# Patient Record
Sex: Female | Born: 1995 | Hispanic: No | Marital: Single | Smoking: Never smoker
Health system: Southern US, Community
[De-identification: ages and names within clinical notes are randomized; demographics above are authoritative.]

## PROBLEM LIST (undated history)

## (undated) DIAGNOSIS — F319 Bipolar disorder, unspecified: Secondary | ICD-10-CM

## (undated) DIAGNOSIS — F32A Depression, unspecified: Secondary | ICD-10-CM

---

## 2020-03-11 ENCOUNTER — Ambulatory Visit
Admission: EM | Admit: 2020-03-11 | Discharge: 2020-03-11 | Disposition: A | Discharge status: Home / Self Care | Payer: Self-pay | Attending: Emergency Medicine | Admitting: Emergency Medicine

## 2020-03-11 ENCOUNTER — Other Ambulatory Visit: Payer: Self-pay

## 2020-03-11 ENCOUNTER — Encounter: Payer: Self-pay | Admitting: Emergency Medicine

## 2020-03-11 ENCOUNTER — Ambulatory Visit (INDEPENDENT_AMBULATORY_CARE_PROVIDER_SITE_OTHER): Payer: Self-pay

## 2020-03-11 DIAGNOSIS — M79672 Pain in left foot: Secondary | ICD-10-CM

## 2020-03-11 DIAGNOSIS — M84375A Stress fracture, left foot, initial encounter for fracture: Secondary | ICD-10-CM

## 2020-03-11 MED ORDER — IBUPROFEN 600 MG PO TABS
600.0000 mg | ORAL_TABLET | Freq: Four times a day (QID) | ORAL | 0 refills | Status: DC | PRN
Start: 2020-03-11 — End: 2022-03-01

## 2020-03-11 NOTE — ED Triage Notes (Signed)
Patient in today c/o left foot pain x 3 weeks. No injury noted. Patient has taken OTC Ibuprofen without relief.

## 2020-03-11 NOTE — ED Provider Notes (Signed)
HPI  SUBJECTIVE:  Kathy Williams is a 24 y.o. female who presents with 3 weeks of left foot pain at the third and fourth metatarsals.  Is located along the dorsum of the foot.  She states it is sharp, constant during the day.  She reports swelling on the dorsum of the foot at the end of the day.  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  She wears chacos which have thin straps in the area of pain.  She states that her foot was bothering her starting about 3 weeks ago, but got acutely worse after jumping up and down 2 weeks ago.  She denies bruising, erythema, fevers, body aches.  No increase in her activity.  She tried ibuprofen 400 mg 2 or 3 times a day, rest.  The ibuprofen, rest and nonweightbearing helped.  Symptoms worse with weightbearing, palpation.  It is not associated with moving her toes.  She denies pain along the plantar aspect of her foot.  She has never had symptoms like this before.  Has medical history none for diabetes, neuropathy, peripheral vascular disease, peripheral arterial disease.  LMP: Now.  Denies possibility being pregnant.  PMD: None.  History reviewed. No pertinent past medical history.  History reviewed. No pertinent surgical history.  Family History  Problem Relation Age of Onset  . Diabetes Mother   . Hypertension Mother   . Atrial fibrillation Father   . Skin cancer Father     Social History   Tobacco Use  . Smoking status: Never Smoker  . Smokeless tobacco: Never Used  Vaping Use  . Vaping Use: Never used  Substance Use Topics  . Alcohol use: Yes    Comment: occassional  . Drug use: Never    No current facility-administered medications for this encounter.  Current Outpatient Medications:  Marland Kitchen  Multiple Vitamin (MULTIVITAMIN) tablet, Take 1 tablet by mouth daily., Disp: , Rfl:  .  ibuprofen (ADVIL) 600 MG tablet, Take 1 tablet (600 mg total) by mouth every 6 (six) hours as needed., Disp: 30 tablet, Rfl: 0  No Known  Allergies   ROS  As noted in HPI.   Physical Exam  BP 121/69 (BP Location: Left Arm)   Pulse 65   Temp 98.5 F (36.9 C) (Oral)   Resp 18   Ht 5\' 6"  (1.676 m)   Wt 79.4 kg   LMP 03/08/2020 (Exact Date)   SpO2 100%   BMI 28.25 kg/m   Constitutional: Well developed, well nourished, no acute distress Eyes:  EOMI, conjunctiva normal bilaterally HENT: Normocephalic, atraumatic,mucus membranes moist Respiratory: Normal inspiratory effort Cardiovascular: Normal rate GI: nondistended skin: No rash, skin intact Musculoskeletal: Left foot: Positive tenderness along the distal third, fourth metatarsals and at the MTP joints.  No tenderness along the plantar aspect of the foot.  Midfoot NT. Base of fifth metatarsal NT. No bruising. Skin intact. DP 2+. Refill less than 2 seconds. Sensation grossly intact. Patient able to move all toes actively.   no pain with inversion / eversion,  dorsiflexion / plantarflexion. No Tenderness along the plantar fascia. Distal fibula NT, Medial malleolus NT,  Deltoid ligament NT, Lateral ligaments NT, Achilles NT. Patient able to bear weight while in department. Neurologic: Alert & oriented x 3, no focal neuro deficits Psychiatric: Speech and behavior appropriate   ED Course   Medications - No data to display  Orders Placed This Encounter  Procedures  . DG Foot Complete Left    Standing Status:  Standing    Number of Occurrences:   1    Order Specific Question:   Reason for Exam (SYMPTOM  OR DIAGNOSIS REQUIRED)    Answer:   pain x 3 weeks, no injury noted    Order Specific Question:   Is patient pregnant?    Answer:   No  . Post op shoe    Standing Status:   Standing    Number of Occurrences:   1    Order Specific Question:   Laterality    Answer:   Left  . Apply ace wrap    Standing Status:   Standing    Number of Occurrences:   1    No results found for this or any previous visit (from the past 24 hour(s)). DG Foot Complete  Left  Addendum Date: 03/11/2020   ADDENDUM REPORT: 03/11/2020 20:12 ADDENDUM: This study was reviewed with the ordering clinician. Patient has point tenderness at the distal margin of the third metatarsal, corresponding to an area of cortical thickening and mild periosteal reaction on this exam. In retrospect, this is consistent with stress reaction of the third metatarsal. Electronically Signed   By: Sharlet Salina M.D.   On: 03/11/2020 20:12   Result Date: 03/11/2020 CLINICAL DATA:  Left foot pain for 3 weeks EXAM: LEFT FOOT - COMPLETE 3+ VIEW COMPARISON:  None. FINDINGS: Frontal, oblique, and lateral views of the left foot are obtained. No fracture, subluxation, or dislocation. Joint spaces are well preserved. Soft tissues are normal. IMPRESSION: 1. Unremarkable left foot. Electronically Signed: By: Sharlet Salina M.D. On: 03/11/2020 19:58    ED Clinical Impression  1. Stress fracture of left foot, initial encounter      ED Assessment/Plan  Reviewed imaging independently and discussed with radiology. No displaced fx. positive bone callus the distal third metatarsal consistent with a healing stress fracture as read by me and agreed with by radiology. See radiology report for full details.  Patient with healing stress fracture.  Will place in a stiff soled shoe, Ace wrap, Tylenol/ibuprofen.  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  will have her follow-up with podiatry in 2 to 3 weeks.   Discussed imaging, MDM, treatment plan, and plan for follow-up with patient.  patient agrees with plan.   Meds ordered this encounter  Medications  . ibuprofen (ADVIL) 600 MG tablet    Sig: Take 1 tablet (600 mg total) by mouth every 6 (six) hours as needed.    Dispense:  30 tablet    Refill:  0    *This clinic note was created using Scientist, clinical (histocompatibility and immunogenetics). Therefore, there may be occasional mistakes despite careful  proofreading.   ?    Domenick Gong, MD 03/11/20 2021

## 2020-03-11 NOTE — Discharge Instructions (Signed)
Wear the Ace wrap and stiff soled shoe.  Try and decrease the amount of activity that you do.  You have a stress fracture and it will not heal until you give it a rest for 4 to 6 weeks.  Follow-up with Dr. Alberteen Spindle podiatry in several weeks.  Take 600 mg of ibuprofen combined with 1000 g of Tylenol 3-4 times a day as needed for pain.  Ice, elevation.

## 2021-01-19 ENCOUNTER — Ambulatory Visit
Admission: RE | Admit: 2021-01-19 | Discharge: 2021-01-19 | Disposition: A | Discharge status: Home / Self Care | Payer: BC Managed Care – PPO | Source: Ambulatory Visit | Attending: Family Medicine | Admitting: Family Medicine

## 2021-01-19 ENCOUNTER — Ambulatory Visit
Admission: EM | Admit: 2021-01-19 | Discharge: 2021-01-19 | Disposition: A | Discharge status: Home / Self Care | Payer: BC Managed Care – PPO | Attending: Family Medicine | Admitting: Family Medicine

## 2021-01-19 ENCOUNTER — Other Ambulatory Visit: Payer: Self-pay

## 2021-01-19 DIAGNOSIS — R1013 Epigastric pain: Secondary | ICD-10-CM | POA: Insufficient documentation

## 2021-01-19 DIAGNOSIS — R1011 Right upper quadrant pain: Secondary | ICD-10-CM | POA: Insufficient documentation

## 2021-01-19 LAB — COMPREHENSIVE METABOLIC PANEL
ALT: 17 U/L (ref 0–44)
AST: 19 U/L (ref 15–41)
Albumin: 4.7 g/dL (ref 3.5–5.0)
Alkaline Phosphatase: 53 U/L (ref 38–126)
Anion gap: 3 — ABNORMAL LOW (ref 5–15)
BUN: 13 mg/dL (ref 6–20)
CO2: 26 mmol/L (ref 22–32)
Calcium: 9.7 mg/dL (ref 8.9–10.3)
Chloride: 106 mmol/L (ref 98–111)
Creatinine, Ser: 0.68 mg/dL (ref 0.44–1.00)
GFR, Estimated: >60 mL/min (ref >60)
Glucose, Bld: 92 mg/dL (ref 70–99)
Potassium: 3.8 mmol/L (ref 3.5–5.1)
Sodium: 135 mmol/L (ref 135–145)
Total Bilirubin: 0.5 mg/dL (ref 0.3–1.2)
Total Protein: 8.3 g/dL — ABNORMAL HIGH (ref 6.5–8.1)

## 2021-01-19 LAB — CBC WITH DIFFERENTIAL/PLATELET
Abs Immature Granulocytes: 0.02 10*3/uL (ref 0.00–0.07)
Basophils Absolute: 0 10*3/uL (ref 0.0–0.1)
Basophils Relative: 0 %
Eosinophils Absolute: 0.1 10*3/uL (ref 0.0–0.5)
Eosinophils Relative: 1 %
HCT: 38.6 % (ref 36.0–46.0)
Hemoglobin: 12.8 g/dL (ref 12.0–15.0)
Immature Granulocytes: 0 %
Lymphocytes Relative: 18 %
Lymphs Abs: 1.5 10*3/uL (ref 0.7–4.0)
MCH: 29 pg (ref 26.0–34.0)
MCHC: 33.2 g/dL (ref 30.0–36.0)
MCV: 87.3 fL (ref 80.0–100.0)
Monocytes Absolute: 0.7 10*3/uL (ref 0.1–1.0)
Monocytes Relative: 8 %
Neutro Abs: 6.2 10*3/uL (ref 1.7–7.7)
Neutrophils Relative %: 73 %
Platelets: 305 10*3/uL (ref 150–400)
RBC: 4.42 MIL/uL (ref 3.87–5.11)
RDW: 13.2 % (ref 11.5–15.5)
WBC: 8.5 10*3/uL (ref 4.0–10.5)
nRBC: 0 % (ref 0.0–0.2)

## 2021-01-19 LAB — PREGNANCY, URINE: Preg Test, Ur: NEGATIVE

## 2021-01-19 MED ORDER — PANTOPRAZOLE SODIUM 40 MG PO TBEC: 40.0000 mg | DELAYED_RELEASE_TABLET | Freq: Every day | ORAL | 0 refills | Status: DC

## 2021-01-19 NOTE — ED Triage Notes (Signed)
Patient presents to Urgent Care with complaints of mid epigastric pain with intermittent vomiting and diarrhea since May. She states she has also lost 10 lbs from constant diarrhea. No GI history. Treating symptoms with Tums with no relief.   Denies fever.

## 2021-01-19 NOTE — Discharge Instructions (Addendum)
We will call with US results.  Medication as prescribed.  Take care  Dr. Cincere Zorn  

## 2021-01-20 NOTE — ED Provider Notes (Signed)
[facility]    CSN: [id number] Arrival date & time: 01/19/21  1143      History   Chief Complaint Chief Complaint  Patient presents with   Abdominal Pain    HPI  25 year old female presents with the above complaint.  Ongoing, intermittent abdominal pain since May. Located in the upper abdomen. Unsure of relation to food. Has had some nausea and vomiting. No significant diarrhea. Reports 10 lb weight loss. Mild pain currently. Last period was late May/early June. No fever. No relieving factors. No other associated symptoms.   Home Medications    Prior to Admission medications   Medication Sig Start Date End Date Taking? Authorizing Provider  pantoprazole (PROTONIX) 40 MG tablet Take 1 tablet (40 mg total) by mouth daily. 01/19/21  Yes Edelmira Gallogly G, DO  ibuprofen (ADVIL) 600 MG tablet Take 1 tablet (600 mg total) by mouth every 6 (six) hours as needed. 03/11/20   Melynda Ripple, MD  Multiple Vitamin (MULTIVITAMIN) tablet Take 1 tablet by mouth daily.    [provider]    Family History Family History  Problem Relation Age of Onset   Diabetes Mother    Hypertension Mother    Atrial fibrillation Father    Skin cancer Father     Social History Social History   Tobacco Use   Smoking status: Never   Smokeless tobacco: Never  Vaping Use   Vaping Use: Never used  Substance Use Topics   Alcohol use: Yes    Comment: occassional   Drug use: Never     Allergies   Patient has no known allergies.   Review of Systems Review of Systems  Constitutional:  Positive for unexpected weight change.  Gastrointestinal:  Positive for abdominal pain, nausea and vomiting.    Physical Exam Triage Vital Signs ED Triage Vitals  Enc Vitals Group     BP 01/19/21 1332 126/80     Pulse Rate 01/19/21 1332 (!) 58     Resp 01/19/21 1332 16     Temp 01/19/21 1332 98.5 F (36.9 C)     Temp Source 01/19/21 1332 Oral     SpO2 01/19/21 1332 100 %     Weight --       Height --      Head Circumference --      Peak Flow --      Pain Score 01/19/21 1335 0     Pain Loc --      Pain Edu? --      Excl. in [place]? --    Updated Vital Signs BP 126/80 (BP Location: Left Arm)   Pulse (!) 58   Temp 98.5 F (36.9 C) (Oral)   Resp 16   LMP 12/23/2020 (Approximate)   SpO2 100%   Visual Acuity Right Eye Distance:   Left Eye Distance:   Bilateral Distance:    Right Eye Near:   Left Eye Near:    Bilateral Near:     Physical Exam Vitals and nursing note reviewed.  Constitutional:      General: She is not in acute distress.    Appearance: Normal appearance. She is not ill-appearing.  HENT:     Head: Normocephalic and atraumatic.  Eyes:     Conjunctiva/sclera: Conjunctivae normal.  Cardiovascular:     Rate and Rhythm: Regular rhythm. Bradycardia present.  Pulmonary:     Effort: Pulmonary effort is normal.     Breath sounds: Normal breath sounds. No wheezing  or rales.  Abdominal:     General: There is no distension.     Palpations: Abdomen is soft.     Comments: Tenderness to palpation in the epigastric region and RUQ.   Neurological:     Mental Status: She is alert.  Psychiatric:        Mood and Affect: Mood normal.        Behavior: Behavior normal.     UC Treatments / Results  Labs (all labs ordered are listed, but only abnormal results are displayed) Labs Reviewed  COMPREHENSIVE METABOLIC PANEL - Abnormal; Notable for the following components:      Result Value   Total Protein 8.3 (*)    Anion gap 3 (*)    All other components within normal limits  PREGNANCY, URINE  CBC WITH DIFFERENTIAL/PLATELET    EKG   Radiology US Abdomen Limited RUQ (LIVER/GB)  Result Date: 01/19/2021 CLINICAL DATA:  Right upper quadrant abdominal pain EXAM: ULTRASOUND ABDOMEN LIMITED RIGHT UPPER QUADRANT COMPARISON:  None. FINDINGS: Gallbladder: No gallstones or wall thickening visualized. No sonographic Murphy sign noted by sonographer. Common bile  duct: Diameter: 3.3 mm.  Normal. Liver: No focal lesion identified. Within normal limits in parenchymal echogenicity. Portal vein is patent on color Doppler imaging with normal direction of blood flow towards the liver. Other: None. IMPRESSION: Normal right upper quadrant ultrasound. No abnormality seen to explain pain. Electronically Signed   By: Nelson Chimes M.D.   On: 01/19/2021 16:09    Procedures Procedures (including critical care time)  Medications Ordered in UC Medications - No data to display  Initial Impression / Assessment and Plan / UC Course  I have reviewed the triage vital signs and the nursing notes.  Pertinent labs & imaging results that were available during my care of the patient were reviewed by me and considered in my medical decision making (see chart for details).    25 year old female presents with epigastric and RUQ pain. Labs unremarkable (normal WBC, alk phos, LFTs). Urine preg negative. RUQ [place] normal as well. Placing on protonix for suspected GERD vs gastritis. Supportive care.   Final Clinical Impressions(s) / UC Diagnoses   Final diagnoses:  Abdominal pain, epigastric  RUQ pain     Discharge Instructions      We will call with [place] results.  Medication as prescribed.  Take care  Dr. Lacinda Axon    ED Prescriptions     Medication Sig Dispense Auth. Provider   pantoprazole (PROTONIX) 40 MG tablet Take 1 tablet (40 mg total) by mouth daily. 30 tablet Coral Spikes, DO      PDMP not reviewed this encounter.   Coral Spikes, [place] 01/20/21 2154

## 2021-03-11 ENCOUNTER — Ambulatory Visit
Admission: EM | Admit: 2021-03-11 | Discharge: 2021-03-11 | Disposition: A | Discharge status: Home / Self Care | Payer: BC Managed Care – PPO | Attending: Sports Medicine | Admitting: Sports Medicine

## 2021-03-11 ENCOUNTER — Other Ambulatory Visit: Payer: Self-pay

## 2021-03-11 DIAGNOSIS — R112 Nausea with vomiting, unspecified: Secondary | ICD-10-CM

## 2021-03-11 DIAGNOSIS — R1084 Generalized abdominal pain: Secondary | ICD-10-CM

## 2021-03-11 DIAGNOSIS — R110 Nausea: Secondary | ICD-10-CM

## 2021-03-11 MED ORDER — ONDANSETRON HCL 4 MG PO TABS
4.0000 mg | ORAL_TABLET | Freq: Four times a day (QID) | ORAL | 0 refills | Status: DC
Start: 2021-03-11 — End: 2022-03-01

## 2021-03-11 MED ORDER — FAMOTIDINE 20 MG PO TABS
20.0000 mg | ORAL_TABLET | Freq: Two times a day (BID) | ORAL | 0 refills | Status: DC
Start: 2021-03-11 — End: 2022-03-01

## 2021-03-11 NOTE — ED Triage Notes (Signed)
Pt c/o continued abdominal pain and nausea for several months. Pt was seen for this recently and given Pantoprazole, pt is unsure if this helped or not. Pt reports current episode has lasted about 3 days. Pt denies f/v/d, urinary or bowel problems.

## 2021-03-11 NOTE — ED Provider Notes (Signed)
[facility]    CSN: [id number] Arrival date & time: 03/11/21  [id number]      History   Chief Complaint Chief Complaint  Patient presents with   Abdominal Pain    HPI Kathy Williams is a 25 y.o. female.   25 year old female who presents for evaluation of intermittent persistent abdominal pain.  She has not had it for about 3 months.  Was seen here in the urgent care by Dr. Adriana Simasook back in June.  Had an ultrasound of her abdomen that was normal.  She reports that her pain is generalized.  She points over the epigastric as well as the right and left upper quadrant.  She reports about a 10 pound weight loss.  She says she does have associated nausea.  She is unsure if it is related to food.  She does state that she does have some emesis at times.  There is no blood or bile in the emesis.  She denies a family history of personal history of Crohn's disease, ileitis or colitis.  Last menstrual period was last week.  She denies pregnancy.  She denies any vaginal discharge or bleeding.  She is sexually active with condoms only.  She does not use oral birth control.  She denies any urinary symptoms including hematuria, dysuria, increased frequency or urgency.  She denies any fever shakes chills.  She denies any chest pain or shortness of breath.  She was given a trial of a PPI for 30 days but she said that did not help her.  She also reports no diarrhea.  She works at a camp.  She denies any tick bite exposure.  No rash.  No real aggravating or relieving factors.  No red flag signs or symptoms elicited on history.   History reviewed. No pertinent past medical history.  There are no problems to display for this patient.   History reviewed. No pertinent surgical history.  OB History   No obstetric history on file.      Home Medications    Prior to Admission medications   Medication Sig Start Date End Date Taking? Authorizing Provider  famotidine (PEPCID) 20 MG tablet Take 1 tablet (20 mg  total) by mouth 2 (two) times daily. 03/11/21  Yes Delton SeeBarnes, Rether Rison, MD  ondansetron (ZOFRAN) 4 MG tablet Take 1 tablet (4 mg total) by mouth every 6 (six) hours. 03/11/21  Yes Delton SeeBarnes, Quintell Bonnin, MD  ibuprofen (ADVIL) 600 MG tablet Take 1 tablet (600 mg total) by mouth every 6 (six) hours as needed. 03/11/20   Domenick GongMortenson, Ashley, MD  Multiple Vitamin (MULTIVITAMIN) tablet Take 1 tablet by mouth daily.    [provider]  pantoprazole (PROTONIX) 40 MG tablet Take 1 tablet (40 mg total) by mouth daily. 01/19/21   Tommie Samsook, Jayce G, DO    Family History Family History  Problem Relation Age of Onset   Diabetes Mother    Hypertension Mother    Atrial fibrillation Father    Skin cancer Father     Social History Social History   Tobacco Use   Smoking status: Never   Smokeless tobacco: Never  Vaping Use   Vaping Use: Never used  Substance Use Topics   Alcohol use: Yes    Comment: occassional   Drug use: Never     Allergies   Patient has no known allergies.   Review of Systems Review of Systems  Constitutional:  Negative for activity change, appetite change, chills, diaphoresis, fatigue and fever.  HENT:  Negative for congestion, ear pain, postnasal drip, rhinorrhea, sinus pressure, sinus pain, sneezing and sore throat.   Eyes:  Negative for pain.  Respiratory:  Negative for cough, chest tightness and shortness of breath.   Cardiovascular:  Negative for chest pain and palpitations.  Gastrointestinal:  Positive for abdominal pain, nausea and vomiting. Negative for abdominal distention, anal bleeding, blood in stool, constipation, diarrhea and rectal pain.  Genitourinary:  Negative for dysuria, flank pain, frequency, hematuria, menstrual problem, pelvic pain, urgency, vaginal bleeding, vaginal discharge and vaginal pain.  Musculoskeletal:  Negative for back pain, myalgias and neck pain.  Skin:  Negative for color change, pallor, rash and wound.  Neurological:  Negative for dizziness,  light-headedness and headaches.  All other systems reviewed and are negative.   Physical Exam Triage Vital Signs ED Triage Vitals  Enc Vitals Group     BP 03/11/21 0917 103/63     Pulse Rate 03/11/21 0917 68     Resp 03/11/21 0917 18     Temp 03/11/21 0917 98.4 F (36.9 C)     Temp Source 03/11/21 0917 Oral     SpO2 03/11/21 0917 100 %     Weight 03/11/21 0915 160 lb (72.6 kg)     Height 03/11/21 0915 5\' 6"  (1.676 m)     Head Circumference --      Peak Flow --      Pain Score 03/11/21 0914 8     Pain Loc --      Pain Edu? --      Excl. in [place]? --    No data found.  Updated Vital Signs BP 103/63 (BP Location: Left Arm)   Pulse 68   Temp 98.4 F (36.9 C) (Oral)   Resp 18   Ht 5\' 6"  (1.676 m)   Wt 72.6 kg   LMP 03/07/2021   SpO2 100%   BMI 25.82 kg/m   Visual Acuity Right Eye Distance:   Left Eye Distance:   Bilateral Distance:    Right Eye Near:   Left Eye Near:    Bilateral Near:     Physical Exam Vitals and nursing note reviewed.  Constitutional:      General: She is not in acute distress.    Appearance: Normal appearance. She is well-developed. She is not ill-appearing, toxic-appearing or diaphoretic.  HENT:     Head: Normocephalic and atraumatic.     Nose: Nose normal.     Mouth/Throat:     Mouth: Mucous membranes are moist.     Pharynx: No pharyngeal swelling.  Eyes:     General: No scleral icterus.    Extraocular Movements: Extraocular movements intact.     Conjunctiva/sclera: Conjunctivae normal.     Pupils: Pupils are equal, round, and reactive to light.  Cardiovascular:     Rate and Rhythm: Normal rate and regular rhythm.     Pulses: Normal pulses.     Heart sounds: Normal heart sounds. No murmur heard.   No friction rub. No gallop.  Pulmonary:     Effort: Pulmonary effort is normal.     Breath sounds: Normal breath sounds. No stridor. No wheezing, rhonchi or rales.  Abdominal:     General: Abdomen is flat. Bowel sounds are normal. There  is no distension. There are no signs of injury.     Palpations: Abdomen is soft. There is no fluid wave, hepatomegaly or splenomegaly.     Tenderness: There is generalized abdominal tenderness. There is no right CVA  tenderness, left CVA tenderness, guarding or rebound. Negative signs include Murphy's sign, Rovsing's sign and McBurney's sign.  Musculoskeletal:     Cervical back: Normal range of motion and neck supple.  Skin:    General: Skin is warm and dry.     Capillary Refill: Capillary refill takes less than 2 seconds.  Neurological:     General: No focal deficit present.     Mental Status: She is alert and oriented to person, place, and time.     UC Treatments / Results  Labs (all labs ordered are listed, but only abnormal results are displayed) Labs Reviewed - No data to display  EKG   Radiology No results found.  Procedures Procedures (including critical care time)  Medications Ordered in UC Medications - No data to display  Initial Impression / Assessment and Plan / UC Course  I have reviewed the triage vital signs and the nursing notes.  Pertinent labs & imaging results that were available during my care of the patient were reviewed by me and considered in my medical decision making (see chart for details).  Clinical impression: 1.  Generalized abdominal pain 2.  Nausea 3.  Non-intractable vomiting  Treatment plan: 1.  The findings and treatment plan were discussed in detail with the patient.  Patient was in agreement. 2.  I recommended doing a trial of Pepcid to see if that assists her. 3.  Also gave her some Zofran to help with her nausea. 4.  She is going to need to establish care with a primary care physician and put a referral in for assistance.  Also gave her instructions on how to do that and contact her insurance company to see which providers in the area or taking new patients. 5.  Gave her the name of 2 gastroenterology groups locally that she can  contact.  Ultimately she is going to need to see them and have a further work-up. 6.  Educational handouts provided. 7.  Just supportive care for now medication and monitor symptoms.  Asked her to to take a log of when she gets the symptoms and what she has eaten prior to it.  She voiced verbal understanding. 8.  If symptoms persist or worsen in any way have advised her to go to the ER to obtain a higher level of care including labs and advanced imaging. 9.  She was stable upon discharge and will follow-up here as needed.    Final Clinical Impressions(s) / UC Diagnoses   Final diagnoses:  Generalized abdominal pain  Nausea  Non-intractable vomiting with nausea, unspecified vomiting type     Discharge Instructions      As we discussed, your abdominal pain is chronic in nature.  We will do a trial of a different medication for this.  I have also prescribed you something for your nausea. You do need to establish care with a primary care provider.  Please check the back of your insurance card.  I have sent in a referral to have somebody assist you with getting a PCP. You do need to see GI.  I have provided you the name of a few providers locally.  Please call them and see if they can see you for this. If your symptoms persist you may need advanced imaging which we would recommend you go to the emergency room to have done. Given that you are not having significant symptoms today I will not check your labs or urine as you are not having no  symptoms. Again if your symptoms in your abdomen persist you will need to have labs as well as advanced imaging which I would recommend to go to the ER to have done.     ED Prescriptions     Medication Sig Dispense Auth. Provider   famotidine (PEPCID) 20 MG tablet Take 1 tablet (20 mg total) by mouth 2 (two) times daily. 60 tablet Delton See, MD   ondansetron (ZOFRAN) 4 MG tablet Take 1 tablet (4 mg total) by mouth every 6 (six) hours. 20 tablet  Delton See, MD      PDMP not reviewed this encounter.   Delton See, MD 03/11/21 1022

## 2021-03-11 NOTE — Discharge Instructions (Addendum)
As we discussed, your abdominal pain is chronic in nature.  We will do a trial of a different medication for this.  I have also prescribed you something for your nausea. You do need to establish care with a primary care provider.  Please check the back of your insurance card.  I have sent in a referral to have somebody assist you with getting a PCP. You do need to see GI.  I have provided you the name of a few providers locally.  Please call them and see if they can see you for this. If your symptoms persist you may need advanced imaging which we would recommend you go to the emergency room to have done. Given that you are not having significant symptoms today I will not check your labs or urine as you are not having no symptoms. Again if your symptoms in your abdomen persist you will need to have labs as well as advanced imaging which I would recommend to go to the ER to have done.

## 2021-12-16 ENCOUNTER — Ambulatory Visit
Admission: EM | Admit: 2021-12-16 | Discharge: 2021-12-16 | Disposition: A | Discharge status: Home / Self Care | Payer: BC Managed Care – PPO

## 2021-12-16 ENCOUNTER — Ambulatory Visit (INDEPENDENT_AMBULATORY_CARE_PROVIDER_SITE_OTHER): Payer: BC Managed Care – PPO

## 2021-12-16 DIAGNOSIS — M7989 Other specified soft tissue disorders: Secondary | ICD-10-CM | POA: Diagnosis not present

## 2021-12-16 DIAGNOSIS — M79644 Pain in right finger(s): Secondary | ICD-10-CM | POA: Diagnosis not present

## 2021-12-16 HISTORY — DX: Bipolar disorder, unspecified: F31.9

## 2021-12-16 HISTORY — DX: Depression, unspecified: F32.A

## 2021-12-16 NOTE — Discharge Instructions (Addendum)
Your x-rays did not demonstrate any broken bones but it does demonstrate a calcification in the soft tissue where you are having pain and feeling the hardball and your skin.  I am can give you the contact information for Dr. Berta Minor with [facility].  She is a hand specialist.  I would call her and make an appointment for follow-up to see what may be able to be done to help you with your pain.  In the meantime use over-the-counter ibuprofen according to the package instructions.  You may also apply ice to your hand for 20 minutes at a time 2-3 times a day as needed.

## 2021-12-16 NOTE — ED Triage Notes (Signed)
Patient is here for "ball I feel just below middle finger at palm on inside of hand". Seems to be painful when touching/pressing on area. No injury known.

## 2021-12-16 NOTE — ED Provider Notes (Signed)
[facility]    CSN: [id number] Arrival date & time: 12/16/21  1848      History   Chief Complaint Chief Complaint  Patient presents with   Hand Problem    HPI Kathy Williams is a 26 y.o. female.   HPI  26 year old female here for evaluation of right hand pain.  Patient reports that for the last month she has felt a hard ball under the skin of her right middle finger where her finger meets her hand.  She is unaware of any injury and she denies any weakness in her grip.  She states that it does hurt when she grips items or when she touches or presses on the area.  There is no redness, no swelling, no bruising, and no numbness or tingling in her finger.  Past Medical History:  Diagnosis Date   Bipolar disorder (HCC)    Depression     There are no problems to display for this patient.   History reviewed. No pertinent surgical history.  OB History   No obstetric history on file.      Home Medications    Prior to Admission medications   Medication Sig Start Date End Date Taking? Authorizing Provider  cyclobenzaprine (FLEXERIL) 10 MG tablet Take by mouth. 11/01/17  Yes [provider]  escitalopram (LEXAPRO) 10 MG tablet Take 10 mg by mouth daily.   Yes [provider]  famotidine (PEPCID) 20 MG tablet Take 1 tablet (20 mg total) by mouth 2 (two) times daily. 03/11/21   Delton SeeBarnes, Kenneth, MD  ibuprofen (ADVIL) 600 MG tablet Take 1 tablet (600 mg total) by mouth every 6 (six) hours as needed. 03/11/20   Domenick GongMortenson, Ashley, MD  Multiple Vitamin (MULTIVITAMIN) tablet Take 1 tablet by mouth daily.    [provider]  ondansetron (ZOFRAN) 4 MG tablet Take 1 tablet (4 mg total) by mouth every 6 (six) hours. 03/11/21   Delton SeeBarnes, Kenneth, MD  pantoprazole (PROTONIX) 40 MG tablet Take 1 tablet (40 mg total) by mouth daily. 01/19/21   Tommie Samsook, Jayce G, DO    Family History Family History  Problem Relation Age of Onset   Diabetes Mother    Hypertension  Mother    Atrial fibrillation Father    Skin cancer Father     Social History Social History   Tobacco Use   Smoking status: Never   Smokeless tobacco: Never  Vaping Use   Vaping Use: Never used  Substance Use Topics   Alcohol use: Not Currently    Comment: occassional   Drug use: Never     Allergies   Patient has no known allergies.   Review of Systems Review of Systems  Musculoskeletal:  Positive for arthralgias. Negative for joint swelling.  Skin:  Negative for color change.  Neurological:  Negative for weakness and numbness.  Hematological: Negative.   Psychiatric/Behavioral: Negative.      Physical Exam Triage Vital Signs ED Triage Vitals  Enc Vitals Group     BP 12/16/21 1921 109/74     Pulse Rate 12/16/21 1921 77     Resp 12/16/21 1921 18     Temp 12/16/21 1921 97.8 F (36.6 C)     Temp Source 12/16/21 1921 Oral     SpO2 12/16/21 1921 99 %     Weight 12/16/21 1917 172 lb 12.8 oz (78.4 kg)     Height 12/16/21 1917 5\' 6"  (1.676 m)     Head Circumference --  Peak Flow --      Pain Score 12/16/21 1914 0     Pain Loc --      Pain Edu? --      Excl. in GC? --    No data found.  Updated Vital Signs BP 109/74 (BP Location: Right Arm)   Pulse 77   Temp 97.8 F (36.6 C) (Oral)   Resp 18   Ht 5\' 6"  (1.676 m)   Wt 172 lb 12.8 oz (78.4 kg)   LMP 10/12/2021 (Exact Date)   SpO2 99%   BMI 27.89 kg/m   Visual Acuity Right Eye Distance:   Left Eye Distance:   Bilateral Distance:    Right Eye Near:   Left Eye Near:    Bilateral Near:     Physical Exam Vitals and nursing note reviewed.  Constitutional:      Appearance: Normal appearance. She is not ill-appearing.  HENT:     Head: Normocephalic and atraumatic.  Musculoskeletal:        General: Tenderness present. No swelling, deformity or signs of injury. Normal range of motion.  Skin:    General: Skin is warm and dry.     Capillary Refill: Capillary refill takes less than 2 seconds.      Findings: No bruising or erythema.  Neurological:     General: No focal deficit present.     Mental Status: She is alert and oriented to person, place, and time.  Psychiatric:        Mood and Affect: Mood normal.        Behavior: Behavior normal.        Thought Content: Thought content normal.        Judgment: Judgment normal.     UC Treatments / Results  Labs (all labs ordered are listed, but only abnormal results are displayed) Labs Reviewed - No data to display  EKG   Radiology DG Finger Middle Right  Result Date: 12/16/2021 CLINICAL DATA:  Pain at MCP, hard "ball" under skin EXAM: RIGHT MIDDLE FINGER 2+V COMPARISON:  None Available. FINDINGS: No acute fracture or dislocation.The joint spaces are preserved.Alignment is unremarkable.No significant soft tissue abnormality or foreign body. A small calcification is noted volar to the second metacarpal head but no abnormality is seen volar to the third. IMPRESSION: No acute osseous abnormality. No radiopaque correlate for the palpable abnormality. Electronically Signed   By: 12/18/2021 M.D.   On: 12/16/2021 20:00    Procedures Procedures (including critical care time)  Medications Ordered in UC Medications - No data to display  Initial Impression / Assessment and Plan / UC Course  I have reviewed the triage vital signs and the nursing notes.  Pertinent labs & imaging results that were available during my care of the patient were reviewed by me and considered in my medical decision making (see chart for details).  Patient is a nontoxic-appearing 26 year old female here for evaluation of a "hard ball" underneath the skin of her right middle finger at the MCP joint.  She states she first noticed it about a month ago.  No associated injury that she can think of.  There is no bruising, edema, or erythema noted.  She has full range of motion in her fingers and full sensation.  Denies numbness or tingling.  There is a palpable hard knot  underneath the skin at the MCP on the volar aspect.  We will obtain radiograph to evaluate bony irregularity.  Right middle finger x-ray independently  reviewed and evaluated by me.  Impression: There is a smooth lucency in the soft tissue at the level of the MCP joint of the right middle finger.  No other soft tissue or bony abnormalities noted on exam.  Radiology overread is pending. Radiology impression states there is no acute fracture or dislocation.  The joint spaces are preserved.  Alignment is unremarkable.  No significant soft tissue abnormality or foreign body.  A small calcification is noted volar to the second metacarpal head but no abnormality seen volar to the third.  Patient has a calcification in her soft tissue of unknown origin.  I will refer her to Dr. Mathis Bud at [facility] for further evaluation.  Patient can use over-the-counter Tylenol and ibuprofen according to the package instructions as needed for pain.   Final Clinical Impressions(s) / UC Diagnoses   Final diagnoses:  Calcification of soft tissue     Discharge Instructions      Your x-rays did not demonstrate any broken bones but it does demonstrate a calcification in the soft tissue where you are having pain and feeling the hardball and your skin.  I am can give you the contact information for Dr. Berta Minor with [facility].  She is a hand specialist.  I would call her and make an appointment for follow-up to see what may be able to be done to help you with your pain.  In the meantime use over-the-counter ibuprofen according to the package instructions.  You may also apply ice to your hand for 20 minutes at a time 2-3 times a day as needed.     ED Prescriptions   None    PDMP not reviewed this encounter.   Becky Augusta, NP 12/16/21 2009

## 2022-03-01 ENCOUNTER — Ambulatory Visit
Admission: EM | Admit: 2022-03-01 | Discharge: 2022-03-01 | Disposition: A | Discharge status: Home / Self Care | Payer: Self-pay | Attending: Emergency Medicine | Admitting: Emergency Medicine

## 2022-03-01 DIAGNOSIS — J069 Acute upper respiratory infection, unspecified: Secondary | ICD-10-CM

## 2022-03-01 DIAGNOSIS — H66002 Acute suppurative otitis media without spontaneous rupture of ear drum, left ear: Secondary | ICD-10-CM

## 2022-03-01 MED ORDER — AMOXICILLIN-POT CLAVULANATE 875-125 MG PO TABS: 1.0000 | ORAL_TABLET | Freq: Two times a day (BID) | ORAL | 0 refills | Status: AC

## 2022-03-01 NOTE — ED Triage Notes (Signed)
Pt here with C/O facial/sinus pressure for 6 days.

## 2022-03-01 NOTE — ED Provider Notes (Signed)
[facility]    CSN: [id number] Arrival date & time: 03/01/22  1847      History   Chief Complaint Chief Complaint  Patient presents with   Facial Pain    HPI Kathy Williams is a 26 y.o. female.   HPI  26 year old female here for evaluation of sinus pressure.  Patient reports that for last 6 days she has been experiencing a frontal headache and sinus pressure in her frontal sinuses.  She has not had a fever or any nasal discharge.  She states that yesterday she started develop pain in her left ear.  She denies any sore throat or cough.  Past Medical History:  Diagnosis Date   Bipolar disorder (HCC)    Depression     There are no problems to display for this patient.   No past surgical history on file.  OB History   No obstetric history on file.      Home Medications    Prior to Admission medications   Medication Sig Start Date End Date Taking? Authorizing Provider  amoxicillin-clavulanate (AUGMENTIN) 875-125 MG tablet Take 1 tablet by mouth every 12 (twelve) hours for 10 days. 03/01/22 03/11/22 Yes Becky Augusta, NP    Family History Family History  Problem Relation Age of Onset   Diabetes Mother    Hypertension Mother    Atrial fibrillation Father    Skin cancer Father     Social History Social History   Tobacco Use   Smoking status: Never   Smokeless tobacco: Never  Vaping Use   Vaping Use: Never used  Substance Use Topics   Alcohol use: Not Currently    Comment: occassional   Drug use: Never     Allergies   Patient has no known allergies.   Review of Systems Review of Systems  Constitutional:  Negative for fever.  HENT:  Positive for congestion, ear discharge, ear pain and sinus pressure. Negative for sore throat.   Respiratory:  Negative for cough.   Neurological:  Positive for headaches.     Physical Exam Triage Vital Signs ED Triage Vitals  Enc Vitals Group     BP 03/01/22 1900 (!) 108/57     Pulse Rate 03/01/22 1900  63     Resp 03/01/22 1900 16     Temp 03/01/22 1900 97.8 F (36.6 C)     Temp Source 03/01/22 1900 Oral     SpO2 03/01/22 1900 100 %     Weight 03/01/22 1859 175 lb (79.4 kg)     Height 03/01/22 1859 5\' 6"  (1.676 m)     Head Circumference --      Peak Flow --      Pain Score 03/01/22 1859 8     Pain Loc --      Pain Edu? --      Excl. in GC? --    No data found.  Updated Vital Signs BP (!) 108/57 (BP Location: Left Arm)   Pulse 63   Temp 97.8 F (36.6 C) (Oral)   Resp 16   Ht 5\' 6"  (1.676 m)   Wt 175 lb (79.4 kg)   LMP 02/23/2022   SpO2 100%   BMI 28.25 kg/m   Visual Acuity Right Eye Distance:   Left Eye Distance:   Bilateral Distance:    Right Eye Near:   Left Eye Near:    Bilateral Near:     Physical Exam Vitals and nursing note reviewed.  Constitutional:  Appearance: Normal appearance. She is not ill-appearing.  HENT:     Head: Normocephalic and atraumatic.     Right Ear: Tympanic membrane, ear canal and external ear normal. There is no impacted cerumen.     Left Ear: Ear canal and external ear normal. There is no impacted cerumen.     Nose: Congestion and rhinorrhea present.     Mouth/Throat:     Mouth: Mucous membranes are moist.     Pharynx: Oropharynx is clear. No oropharyngeal exudate or posterior oropharyngeal erythema.  Cardiovascular:     Rate and Rhythm: Normal rate and regular rhythm.     Pulses: Normal pulses.     Heart sounds: Normal heart sounds. No murmur heard.    No friction rub. No gallop.  Pulmonary:     Effort: Pulmonary effort is normal.     Breath sounds: Normal breath sounds. No wheezing, rhonchi or rales.  Musculoskeletal:     Cervical back: Normal range of motion and neck supple.  Lymphadenopathy:     Cervical: No cervical adenopathy.  Skin:    General: Skin is warm and dry.     Capillary Refill: Capillary refill takes less than 2 seconds.  Neurological:     General: No focal deficit present.     Mental Status: She is  alert and oriented to person, place, and time.  Psychiatric:        Mood and Affect: Mood normal.        Behavior: Behavior normal.        Thought Content: Thought content normal.        Judgment: Judgment normal.      UC Treatments / Results  Labs (all labs ordered are listed, but only abnormal results are displayed) Labs Reviewed - No data to display  EKG   Radiology No results found.  Procedures Procedures (including critical care time)  Medications Ordered in UC Medications - No data to display  Initial Impression / Assessment and Plan / UC Course  I have reviewed the triage vital signs and the nursing notes.  Pertinent labs & imaging results that were available during my care of the patient were reviewed by me and considered in my medical decision making (see chart for details).  Patient is a very pleasant, nontoxic-appearing 26 year old female here for evaluation of 60s with a sinus pressure, frontal headache, and left ear pain as outlined in HPI above.  Physical exam reveals an erythematous left tympanic membrane.  The external auditory canal is clear.  Right TM is pearly gray appearance with normal light reflex and clear external auditory canal.  Nasal mucosa is erythematous and edematous without any appreciable discharge.  Oropharyngeal exam is benign.  No cervical lymphadenopathy appreciable exam.  Cardiopulmonary exam reveals S1-S2 heart sounds with regular rate and rhythm and lung sounds are clear to auscultation all fields.  Patient exam is consistent with an upper respiratory infection and left otitis media.  I will place her on Augmentin twice daily for 10 days for treatment of her otitis media.   Final Clinical Impressions(s) / UC Diagnoses   Final diagnoses:  Non-recurrent acute suppurative otitis media of left ear without spontaneous rupture of tympanic membrane  Acute upper respiratory infection     Discharge Instructions      Take the Augmentin twice  daily for 10 days with food for treatment of your ear infection.  Take an over-the-counter probiotic 1 hour after each dose of antibiotic to prevent diarrhea.  Use  over-the-counter Tylenol and ibuprofen as needed for pain or fever.  Place a hot water bottle, or heating pad, underneath your pillowcase at night to help dilate up your ear and aid in pain relief as well as resolution of the infection.  Return for reevaluation for any new or worsening symptoms.      ED Prescriptions     Medication Sig Dispense Auth. Provider   amoxicillin-clavulanate (AUGMENTIN) 875-125 MG tablet Take 1 tablet by mouth every 12 (twelve) hours for 10 days. 20 tablet Becky Augusta, NP      PDMP not reviewed this encounter.   Becky Augusta, NP 03/01/22 1952

## 2022-03-01 NOTE — Discharge Instructions (Signed)
Take the Augmentin twice daily for 10 days with food for treatment of your ear infection.  Take an over-the-counter probiotic 1 hour after each dose of antibiotic to prevent diarrhea.  Use over-the-counter Tylenol and ibuprofen as needed for pain or fever.  Place a hot water bottle, or heating pad, underneath your pillowcase at night to help dilate up your ear and aid in pain relief as well as resolution of the infection.  Return for reevaluation for any new or worsening symptoms.  

## 2023-04-21 IMAGING — US US ABDOMEN LIMITED
1 series · 15 of 25 positions shown · non-contrast
Comparison: None.

CLINICAL DATA: Right upper quadrant abdominal pain

EXAM:
ULTRASOUND ABDOMEN LIMITED RIGHT UPPER QUADRANT

[Series 1: us abdomen limited ruq · 15 of 60 slices shown]
[im 1/60]
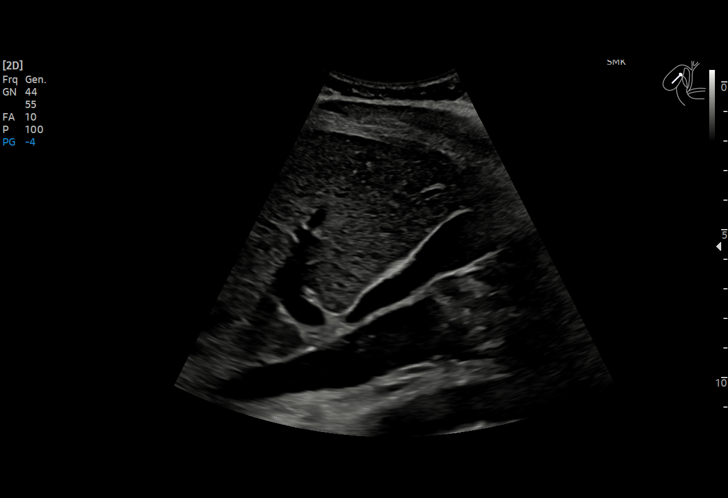
[im 5/60]
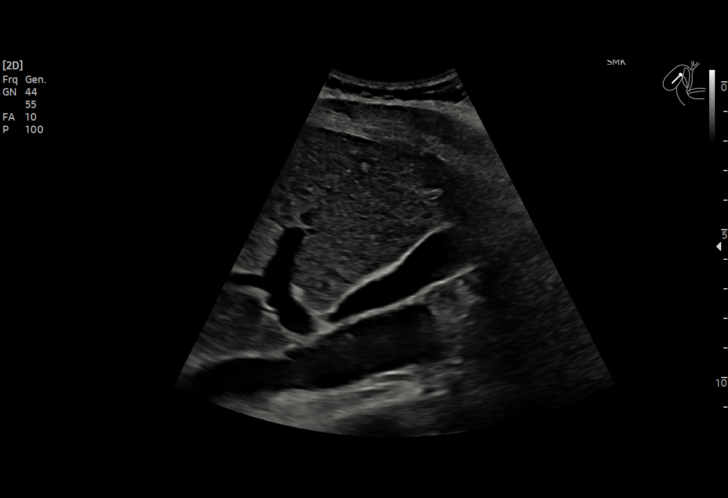
[im 10/60]
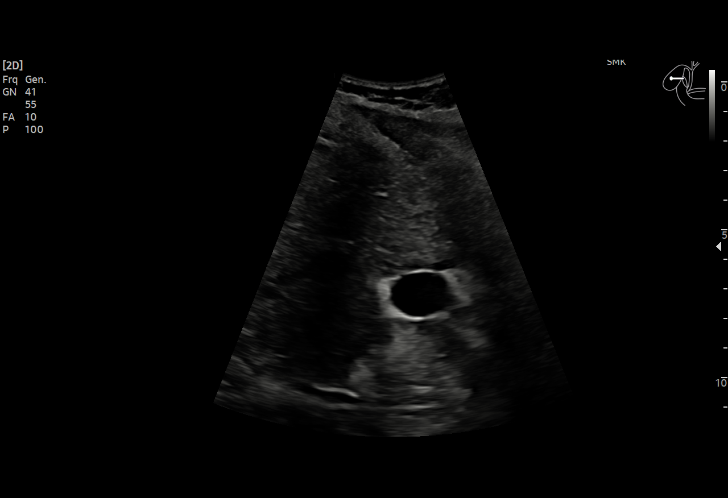
[im 13/60]
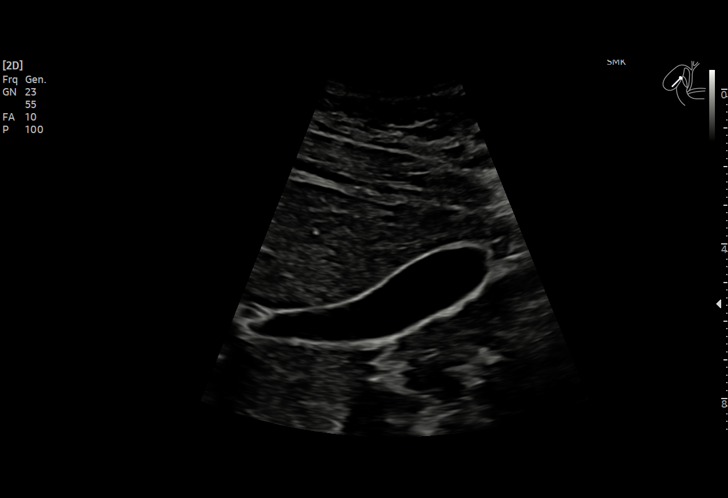
[im 18/60]
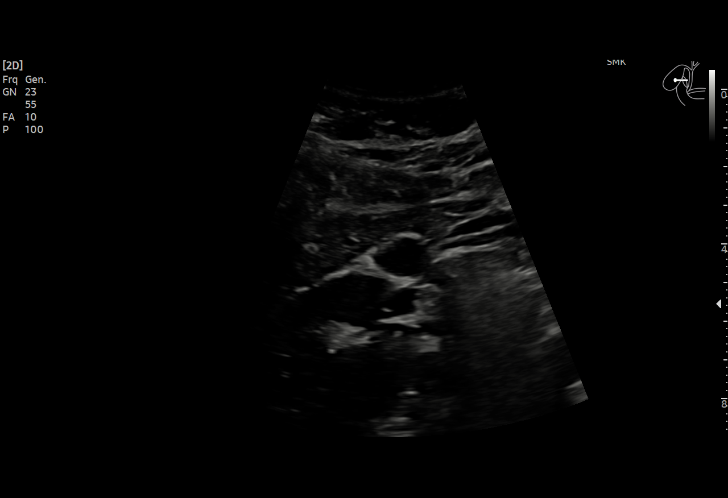
[im 23/60]
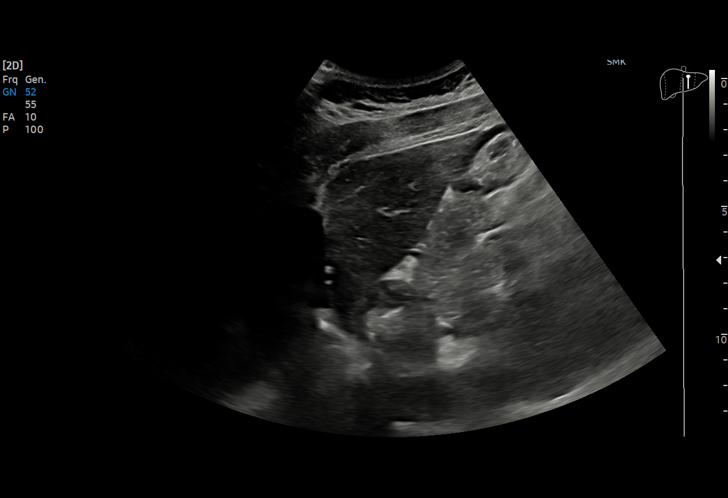
[im 25/60]
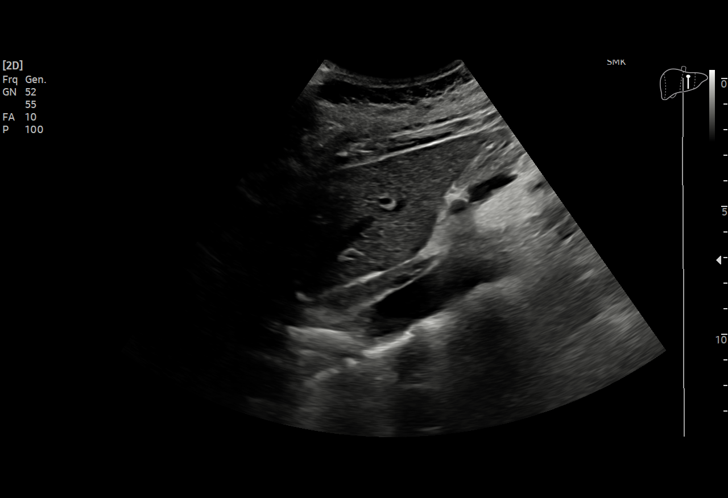
[im 30/60]
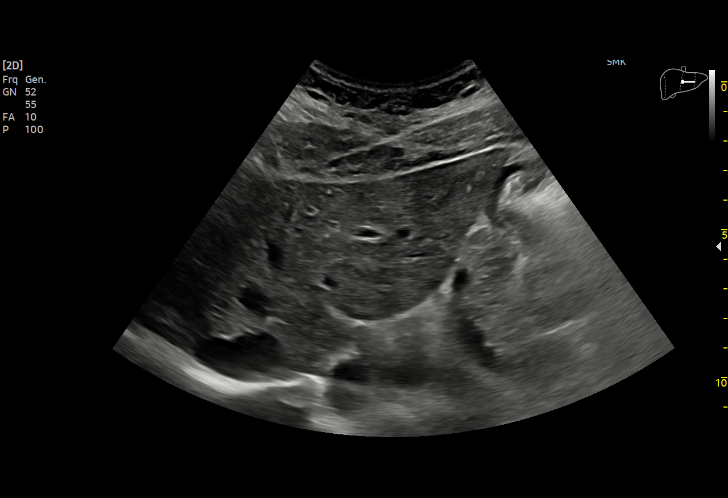
[im 35/60]
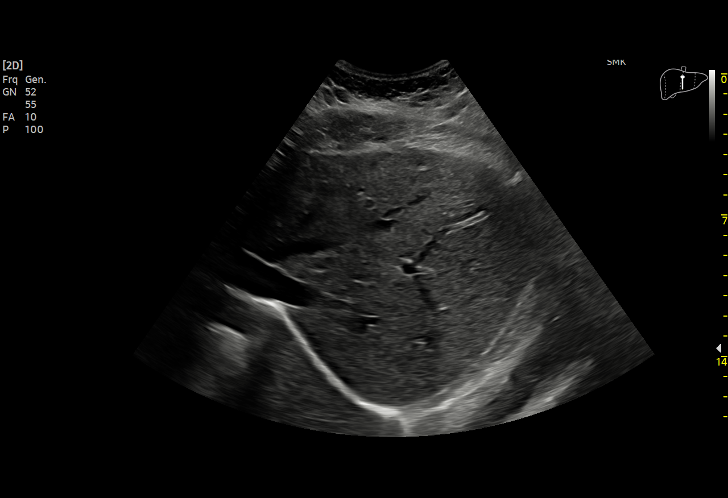
[im 37/60]
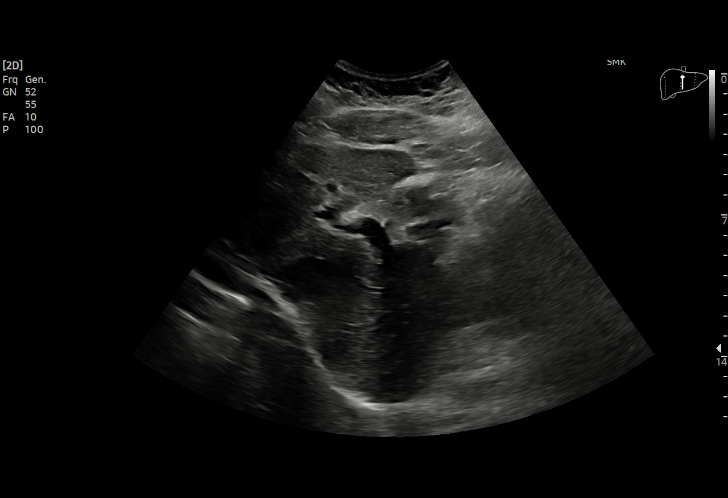
[im 42/60]
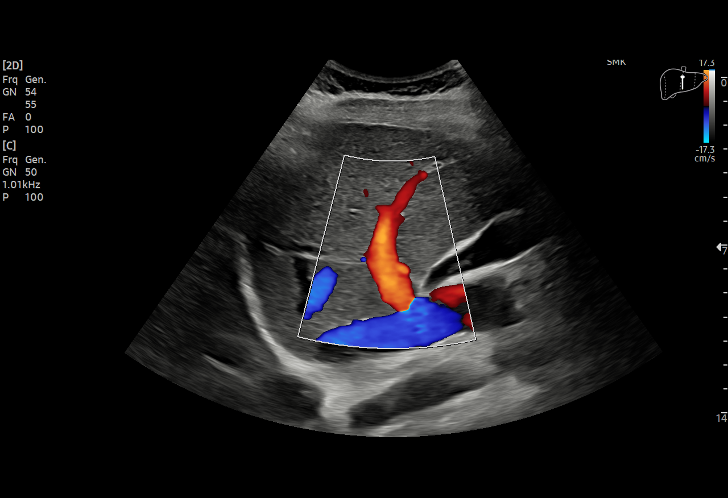
[im 47/60]
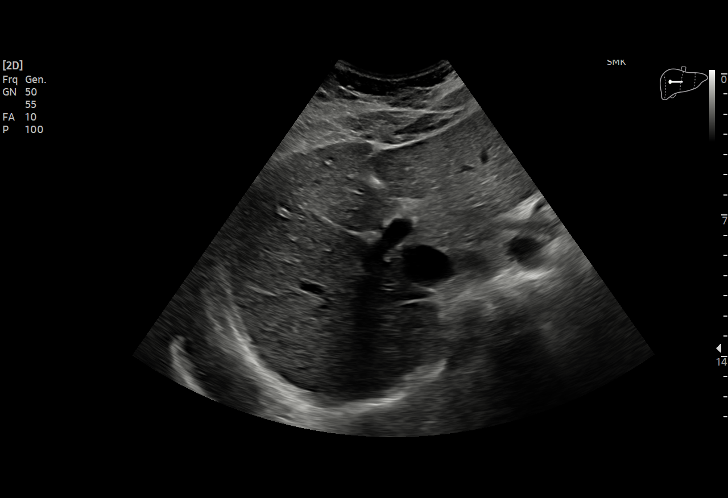
[im 50/60]
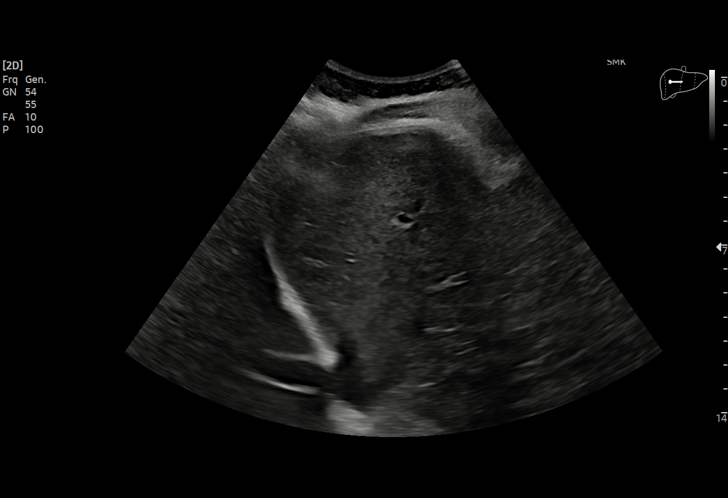
[im 55/60]
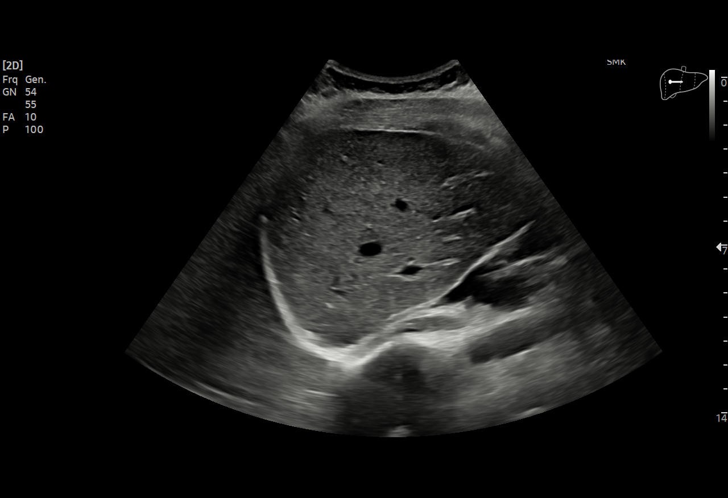
[im 60/60]
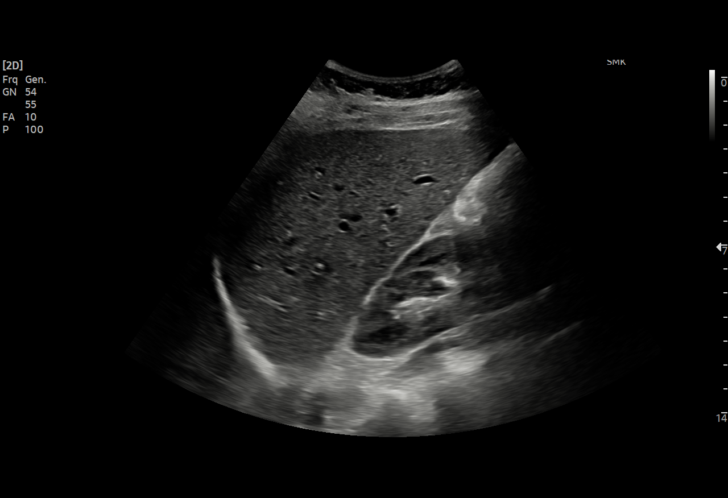

[15 of 25 positions shown; findings below may reference images not displayed]

FINDINGS: Gallbladder:

No gallstones or wall thickening visualized. No sonographic Murphy
sign noted by sonographer.

Common bile duct:

Diameter: 3.3 mm.  Normal.

Liver:

No focal lesion identified. Within normal limits in parenchymal
echogenicity. Portal vein is patent on color Doppler imaging with
normal direction of blood flow towards the liver.

Other: None.
IMPRESSION: Normal right upper quadrant ultrasound. No abnormality seen to
explain pain.

## 2024-03-17 IMAGING — CR DG FINGER MIDDLE 2+V*R*
3 series · 3 of 3 positions shown · non-contrast
Comparison: None Available.

CLINICAL DATA: Pain at MCP, hard "ball" under skin

EXAM:
RIGHT MIDDLE FINGER 2+V

[finger ap]
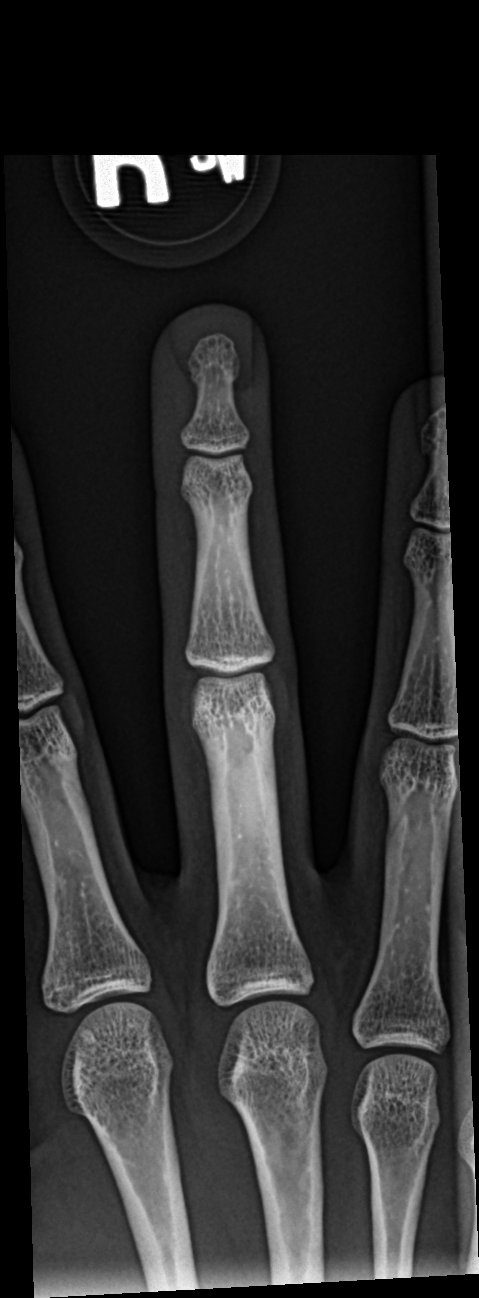

[finger obl]
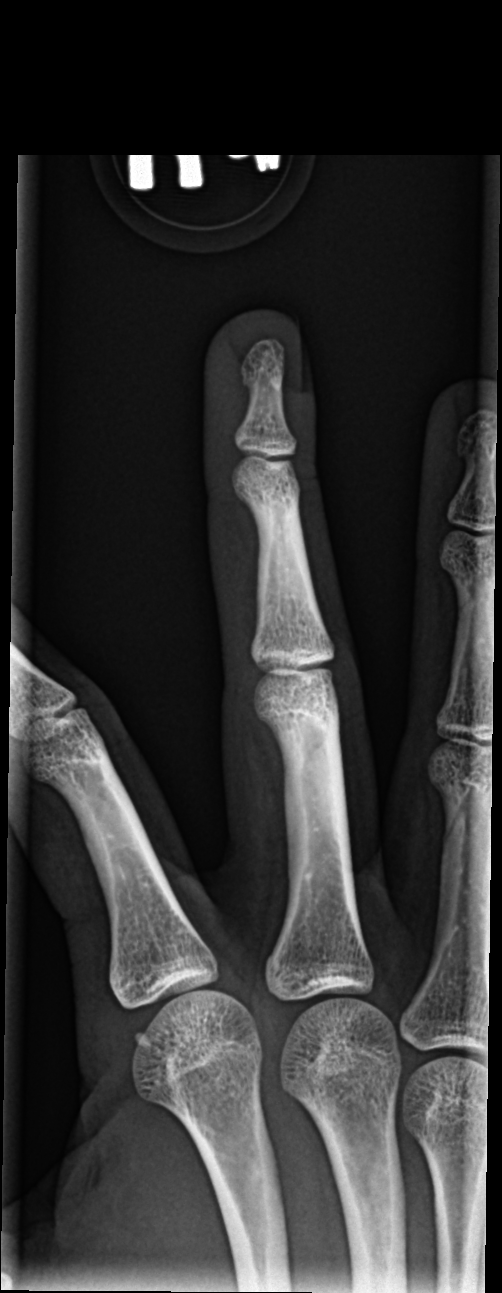

[finger lat]
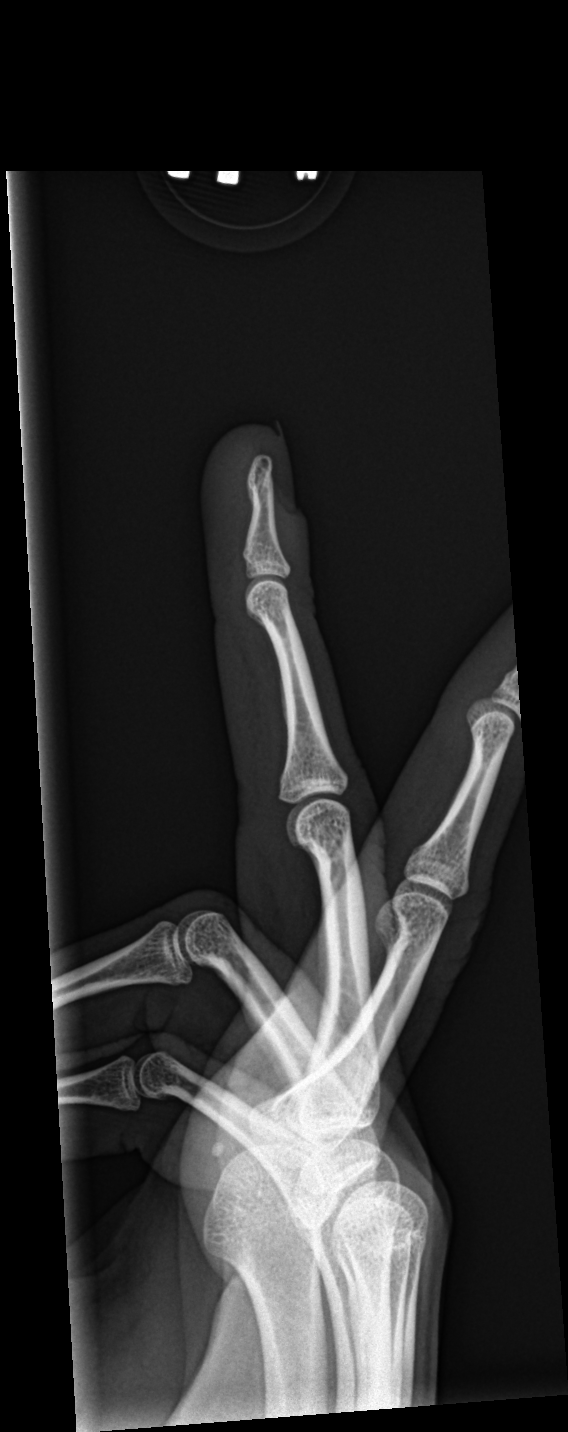

[3 of 3 positions shown; findings below may reference images not displayed]

FINDINGS: No acute fracture or dislocation.The joint spaces are
preserved.Alignment is unremarkable.No significant soft tissue
abnormality or foreign body. A small calcification is noted volar to
the second metacarpal head but no abnormality is seen volar to the
third.
IMPRESSION: No acute osseous abnormality. No radiopaque correlate for the
palpable abnormality.
